# Patient Record
Sex: Male | Born: 1995 | Race: White | Hispanic: No | Marital: Single | Smoking: Never smoker
Health system: Southern US, Community
[De-identification: ages and names within clinical notes are randomized; demographics above are authoritative.]

---
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
## 2016-01-01 ENCOUNTER — Encounter: Payer: Self-pay | Admitting: Nurse Practitioner

## 2016-05-12 ENCOUNTER — Ambulatory Visit (INDEPENDENT_AMBULATORY_CARE_PROVIDER_SITE_OTHER): Payer: BLUE CROSS/BLUE SHIELD | Admitting: Family Medicine

## 2016-05-12 ENCOUNTER — Encounter: Payer: Self-pay | Admitting: Family Medicine

## 2016-05-12 VITALS — BP 110/70 | Temp 98.1°F | Wt 169.2 lb

## 2016-05-12 DIAGNOSIS — I889 Nonspecific lymphadenitis, unspecified: Secondary | ICD-10-CM

## 2016-05-12 DIAGNOSIS — J029 Acute pharyngitis, unspecified: Secondary | ICD-10-CM

## 2016-05-12 LAB — POCT RAPID STREP A (OFFICE): RAPID STREP A SCREEN: NEGATIVE

## 2016-05-12 MED ORDER — AZITHROMYCIN 250 MG PO TABS: ORAL_TABLET | ORAL | 0 refills | Status: DC

## 2016-05-12 MED ORDER — AMPHETAMINE-DEXTROAMPHETAMINE 5 MG PO TABS: ORAL_TABLET | ORAL | 0 refills | Status: DC

## 2016-05-12 MED ORDER — AMPHETAMINE-DEXTROAMPHET ER 15 MG PO CP24: 15.0000 mg | ORAL_CAPSULE | Freq: Every day | ORAL | 0 refills | Status: DC

## 2016-05-12 MED ORDER — OSELTAMIVIR PHOSPHATE 75 MG PO CAPS: 75.0000 mg | ORAL_CAPSULE | Freq: Two times a day (BID) | ORAL | 0 refills | Status: AC

## 2016-05-12 NOTE — Patient Instructions (Signed)
Exudative tonsillitie s

## 2016-05-12 NOTE — Progress Notes (Signed)
   Subjective:    Patient ID: Connor Cervantes, Connor Cervantes    DOB: 11/20/1995, 20 y.o.   MRN: [id number]  Fever   This is a new problem. The current episode started in the past 7 days. The problem occurs intermittently. The problem has been unchanged. The maximum temperature noted was 101 to 101.9 F. Associated symptoms include congestion, ear pain and a sore throat. Treatments tried: tylenol cold and flu. The treatment provided no relief.   Had a prodroem a week ago, fever chills felt bad  Woke up feelin g bretter  Results for orders placed or performed in visit on 05/12/16  POCT rapid strep A  Result Value Ref Range   Rapid Strep A Screen Negative Negative   Congestion   And felt better, now c o throat   Cough not sig and sig congestion  Neck sore and tend r   Both ears painful difficult to swallow     Mono like symtpoms  Review of Systems  Constitutional: Positive for fever.  HENT: Positive for congestion, ear pain and sore throat.        Objective:   Physical Exam  Alert active good hydration HEENT pharynx erythematous, some exudate, tender anterior nodes neck supple lungs clear. Heart regular in rhythm.  This Results for orders placed or performed in visit on 05/12/16  Strep A DNA probe  Result Value Ref Range   Strep Gp A Direct, DNA Probe Negative Negative  POCT rapid strep A  Result Value Ref Range   Rapid Strep A Screen Negative Negative   .      Assessment & Plan:  Impressio bacterial pharyngitis with secondary lymphadenitis versus early flu versus mono. 2 early to test for mono. Patient did have a chronic component and now acute symptomatology plan anti-flu therapy and antibacterial therapy rationale discussed symptom care discussed

## 2016-05-13 LAB — STREP A DNA PROBE: STREP GP A DIRECT, DNA PROBE: NEGATIVE

## 2016-05-14 ENCOUNTER — Telehealth: Payer: Self-pay | Admitting: Family Medicine

## 2016-05-14 MED ORDER — ONDANSETRON 4 MG PO TBDP: 4.0000 mg | ORAL_TABLET | Freq: Three times a day (TID) | ORAL | 0 refills | Status: DC | PRN

## 2016-05-14 NOTE — Telephone Encounter (Signed)
Nurses, call family and plz spk to mom a bit before issuing our recommendations to better document his symtooms. We can Can add zofran 4 odt , numb 16,  stay on same meds, if vom persists may need to to to er this weekend for I v fluids

## 2016-05-14 NOTE — Telephone Encounter (Signed)
Mother states she feels the vomiting is coming from the flu. Prescription sent electronically to pharmacy. Mother will take patient to ER if vomiting continues.

## 2016-05-14 NOTE — Telephone Encounter (Signed)
Patient seen on 05/12/16 by Dr. Brett CanalesSteve for sore throat.  Mom says that he is a lot worse than he was.  He is experiencing a lot of vomiting and can't keep anything down.  She wants to know what Dr. Brett CanalesSteve recommends?

## 2016-10-04 ENCOUNTER — Ambulatory Visit (INDEPENDENT_AMBULATORY_CARE_PROVIDER_SITE_OTHER): Payer: BLUE CROSS/BLUE SHIELD | Admitting: Family Medicine

## 2016-10-04 ENCOUNTER — Encounter: Payer: Self-pay | Admitting: Family Medicine

## 2016-10-04 VITALS — BP 122/76 | Temp 98.0°F | Ht 75.75 in | Wt 173.0 lb

## 2016-10-04 DIAGNOSIS — J329 Chronic sinusitis, unspecified: Secondary | ICD-10-CM

## 2016-10-04 DIAGNOSIS — J310 Chronic rhinitis: Secondary | ICD-10-CM

## 2016-10-04 DIAGNOSIS — F988 Other specified behavioral and emotional disorders with onset usually occurring in childhood and adolescence: Secondary | ICD-10-CM

## 2016-10-04 MED ORDER — AMPHETAMINE-DEXTROAMPHETAMINE 5 MG PO TABS: ORAL_TABLET | ORAL | 0 refills | Status: DC

## 2016-10-04 MED ORDER — AMPHETAMINE-DEXTROAMPHET ER 15 MG PO CP24: 15.0000 mg | ORAL_CAPSULE | Freq: Every day | ORAL | 0 refills | Status: DC

## 2016-10-04 MED ORDER — AMOXICILLIN-POT CLAVULANATE 875-125 MG PO TABS: 1.0000 | ORAL_TABLET | Freq: Two times a day (BID) | ORAL | 0 refills | Status: DC

## 2016-10-04 NOTE — Progress Notes (Signed)
   Subjective:    Patient ID: Jacquenette ShoneWilliam W Klausing, male    DOB: 03/23/1996, 21 y.o.   MRN: [id number]  Sinusitis  This is a new problem. Episode onset: one and half to two weeks. Associated symptoms include congestion and coughing. Treatments tried: dayquil.    major business dminsiatratioin  Going on one and a half weeks  Pos cong and stuffiness , Deep bronchial cough productive at times. Positive nasal discharge frontal headache. Worse with change of position  attention deficit and hyperactivity disorder Patient notes his medication overall is helping. No obvious side effects from medicine. Definitely does better when he takes it. Requests refill on medications.   Review of Systems  HENT: Positive for congestion.   Respiratory: Positive for cough.        Objective:   Physical Exam  Alert, mild malaise. Hydration good Vitals stable. frontal/ maxillary tenderness evident positive nasal congestion. pharynx normal neck supple  lungs clear/no crackles or wheezes. heart regular in rhythm       Assessment & Plan:  Impression rhinosinusitis likely post viral, discussed with patient. plan antibiotics prescribed. Questions answered. Symptomatic care discussed. warning signs discussed. WSL Plus element of bronchitis. Prescribed antibiotics. Symptom care discussed. Cough medicine discuss. ADHD medications refilled. Proper use discussed.

## 2016-11-04 ENCOUNTER — Encounter: Payer: Self-pay | Admitting: Family Medicine

## 2016-11-04 ENCOUNTER — Ambulatory Visit (INDEPENDENT_AMBULATORY_CARE_PROVIDER_SITE_OTHER): Payer: BLUE CROSS/BLUE SHIELD | Admitting: Family Medicine

## 2016-11-04 VITALS — BP 124/80 | Temp 98.4°F | Ht 75.75 in | Wt 179.0 lb

## 2016-11-04 DIAGNOSIS — J4520 Mild intermittent asthma, uncomplicated: Secondary | ICD-10-CM | POA: Diagnosis not present

## 2016-11-04 DIAGNOSIS — J209 Acute bronchitis, unspecified: Secondary | ICD-10-CM | POA: Diagnosis not present

## 2016-11-04 MED ORDER — ALBUTEROL SULFATE HFA 108 (90 BASE) MCG/ACT IN AERS: 2.0000 | INHALATION_SPRAY | Freq: Four times a day (QID) | RESPIRATORY_TRACT | 2 refills | Status: DC | PRN

## 2016-11-04 MED ORDER — CLARITHROMYCIN 500 MG PO TABS: 500.0000 mg | ORAL_TABLET | Freq: Two times a day (BID) | ORAL | 0 refills | Status: DC

## 2016-11-04 NOTE — Progress Notes (Signed)
   Subjective:    Patient ID: Connor Cervantes, male    DOB: 09/15/1995, 21 y.o.   MRN: [id number]  Cough  This is a new problem. Episode onset: early May. Treatments tried: amoxil.    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        Objective:   Physical Exam  Alert active no significant distress slight nasal congestion. HEENT otherwise normal. Lungs clear currently rare cough during exam heart regular in rhythm.      Assessment & Plan:  Impression subacute bronchitis likely with an element of reactive airways though non-auscultated today. Plan 1 more round of antibiotics appropriate for resistant bacteria. Albuterol 2 sprays 4 times a day symptom care discussed

## 2016-12-29 ENCOUNTER — Ambulatory Visit (INDEPENDENT_AMBULATORY_CARE_PROVIDER_SITE_OTHER): Payer: BLUE CROSS/BLUE SHIELD | Admitting: Family Medicine

## 2016-12-29 ENCOUNTER — Encounter (HOSPITAL_COMMUNITY): Payer: Self-pay | Admitting: Emergency Medicine

## 2016-12-29 ENCOUNTER — Emergency Department (HOSPITAL_COMMUNITY): Payer: BLUE CROSS/BLUE SHIELD

## 2016-12-29 ENCOUNTER — Encounter: Payer: Self-pay | Admitting: Family Medicine

## 2016-12-29 ENCOUNTER — Emergency Department (HOSPITAL_COMMUNITY)
Admission: EM | Admit: 2016-12-29 | Discharge: 2016-12-29 | Disposition: A | Discharge status: Home / Self Care | Payer: BLUE CROSS/BLUE SHIELD | Attending: Emergency Medicine | Admitting: Emergency Medicine

## 2016-12-29 VITALS — BP 112/60 | Temp 103.0°F | Ht 75.75 in | Wt 166.4 lb

## 2016-12-29 DIAGNOSIS — J111 Influenza due to unidentified influenza virus with other respiratory manifestations: Secondary | ICD-10-CM

## 2016-12-29 DIAGNOSIS — R531 Weakness: Secondary | ICD-10-CM | POA: Insufficient documentation

## 2016-12-29 DIAGNOSIS — Z79899 Other long term (current) drug therapy: Secondary | ICD-10-CM | POA: Diagnosis not present

## 2016-12-29 DIAGNOSIS — R000 Tachycardia, unspecified: Secondary | ICD-10-CM | POA: Diagnosis not present

## 2016-12-29 DIAGNOSIS — R509 Fever, unspecified: Secondary | ICD-10-CM

## 2016-12-29 DIAGNOSIS — R0689 Other abnormalities of breathing: Secondary | ICD-10-CM | POA: Insufficient documentation

## 2016-12-29 DIAGNOSIS — R69 Illness, unspecified: Secondary | ICD-10-CM | POA: Insufficient documentation

## 2016-12-29 LAB — URINALYSIS, ROUTINE W REFLEX MICROSCOPIC
Bacteria, UA: NONE SEEN
Bilirubin Urine: NEGATIVE
Glucose, UA: NEGATIVE mg/dL
Hgb urine dipstick: NEGATIVE
Ketones, ur: NEGATIVE mg/dL
Leukocytes, UA: NEGATIVE
Nitrite: NEGATIVE
Protein, ur: 30 mg/dL — AB
RBC / HPF: NONE SEEN RBC/hpf (ref 0–5)
Specific Gravity, Urine: 1.024 (ref 1.005–1.030)
Squamous Epithelial / HPF: NONE SEEN
pH: 7 (ref 5.0–8.0)

## 2016-12-29 LAB — CBC WITH DIFFERENTIAL/PLATELET
Basophils Absolute: 0 10*3/uL (ref 0.0–0.1)
Basophils Relative: 0 %
Eosinophils Absolute: 0 10*3/uL (ref 0.0–0.7)
Eosinophils Relative: 0 %
HCT: 42.8 % (ref 39.0–52.0)
Hemoglobin: 14.3 g/dL (ref 13.0–17.0)
Lymphocytes Relative: 13 %
Lymphs Abs: 1.3 10*3/uL (ref 0.7–4.0)
MCH: 30.7 pg (ref 26.0–34.0)
MCHC: 33.4 g/dL (ref 30.0–36.0)
MCV: 91.8 fL (ref 78.0–100.0)
Monocytes Absolute: 1.6 10*3/uL — ABNORMAL HIGH (ref 0.1–1.0)
Monocytes Relative: 16 %
Neutro Abs: 6.8 10*3/uL (ref 1.7–7.7)
Neutrophils Relative %: 71 %
Platelets: 164 10*3/uL (ref 150–400)
RBC: 4.66 MIL/uL (ref 4.22–5.81)
RDW: 13.4 % (ref 11.5–15.5)
WBC: 9.7 10*3/uL (ref 4.0–10.5)

## 2016-12-29 LAB — LIPASE, BLOOD: Lipase: 32 U/L (ref 11–51)

## 2016-12-29 LAB — COMPREHENSIVE METABOLIC PANEL
ALBUMIN: 4.2 g/dL (ref 3.5–5.0)
ALK PHOS: 88 U/L (ref 38–126)
ALT: 22 U/L (ref 17–63)
AST: 25 U/L (ref 15–41)
Anion gap: 9 (ref 5–15)
BILIRUBIN TOTAL: 0.6 mg/dL (ref 0.3–1.2)
BUN: 18 mg/dL (ref 6–20)
CO2: 26 mmol/L (ref 22–32)
CREATININE: 1.46 mg/dL — AB (ref 0.61–1.24)
Calcium: 9.2 mg/dL (ref 8.9–10.3)
Chloride: 102 mmol/L (ref 101–111)
GFR calc Af Amer: >60 mL/min (ref >60)
GFR calc non Af Amer: >60 mL/min (ref >60)
GLUCOSE: 102 mg/dL — AB (ref 65–99)
POTASSIUM: 3.6 mmol/L (ref 3.5–5.1)
Sodium: 137 mmol/L (ref 135–145)
TOTAL PROTEIN: 7.8 g/dL (ref 6.5–8.1)

## 2016-12-29 LAB — INFLUENZA PANEL BY PCR (TYPE A & B)
Influenza A By PCR: NEGATIVE
Influenza B By PCR: NEGATIVE

## 2016-12-29 LAB — MONONUCLEOSIS SCREEN: MONO SCREEN: NEGATIVE

## 2016-12-29 MED ORDER — IBUPROFEN 400 MG PO TABS
600.0000 mg | ORAL_TABLET | Freq: Once | ORAL | Status: AC
  Administered 2016-12-29: 600 mg via ORAL
  Filled 2016-12-29: qty 2

## 2016-12-29 MED ORDER — SODIUM CHLORIDE 0.9 % IV BOLUS (SEPSIS)
1000.0000 mL | Freq: Once | INTRAVENOUS | Status: AC
  Administered 2016-12-29: 1000 mL via INTRAVENOUS

## 2016-12-29 MED ORDER — SODIUM CHLORIDE 0.9 % IV BOLUS (SEPSIS)
1000.0000 mL | Freq: Once | INTRAVENOUS | Status: AC
Start: 2016-12-29 — End: 2016-12-29
  Administered 2016-12-29: 1000 mL via INTRAVENOUS

## 2016-12-29 MED ORDER — ACETAMINOPHEN 500 MG PO TABS
1000.0000 mg | ORAL_TABLET | Freq: Once | ORAL | Status: AC
  Administered 2016-12-29: 1000 mg via ORAL
  Filled 2016-12-29: qty 2

## 2016-12-29 NOTE — Progress Notes (Signed)
   Subjective:    Patient ID: Jacquenette ShoneWilliam W Flavell, male    DOB: 04/05/1996, 21 y.o.   MRN: [id number]  Fever     Patient is here with his mother Verlon AuLeslie, patient has had a bad cough since May which got better,but has returned. Reports had a temp this am of 104.4 took tylenol for this. Patient reports being very active the past week traveling. Diminished intake. Did get dehydrated by his own view. 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 Developed substantial fever. In fact Tmax of 104. Also significant cough No other concerns. Review of Systems  Constitutional: Positive for fever.       Objective:   Physical Exam  Alert moderate malaise systolic blood pressure dropped substantially laying to standing by 12 the 15 points pharynx erythematous neck supple lungs clear heart rare rhythm abdomen benign      Assessment & Plan:  Impression sudden onset of very impressive fever along with substantial malaise and orthostatic changes this is enough to warrant further urgent workup discussed with family since the ER

## 2016-12-29 NOTE — ED Provider Notes (Signed)
[facility]Y DEPT Provider Note   CSN: [id number] Arrival date & time: 12/29/16  1520     History   Chief Complaint No chief complaint on file.   HPI Connor Cervantes is a 21 y.o. male.   Fever   Pertinent negatives include no vomiting.    Patient presents with his mother who assists with the history of present illness. Patient is in generally well 21 year old male presenting with one day of fever, weakness,generalized fatigue. The patient states that he is generally well he does note history of recurrent viral illnesses. However, until yesterday the patient was generally well. He notes that in the days prior to becoming ill he traveled substantially, [place] from [place] to [place], went to a concert, [place] from [place] to [place], and during his time there did not eat sufficiently, sleep sufficiently. Yesterday, work, as a Engineer, drillingwarehouse staff, he felt progressively weak, and febrile. No associated vomiting, diarrhea, cough, sore throat, neck pain, rash. Minimal relief with Tylenol. Patient went to his physicians today, and after being evaluated, found to have fever, orthostatic hypotension, he was sent here for evaluation.   No past medical history on file.  Patient Active Problem List   Diagnosis Date Noted  . ADD (attention deficit disorder) 06/18/2013    No past surgical history on file.     Home Medications    Prior to Admission medications   Medication Sig Start Date End Date Taking? Authorizing Provider  acetaminophen (TYLENOL) 325 MG tablet Take 650 mg by mouth every 6 (six) hours as needed for moderate pain or fever.   Yes [provider]  albuterol (PROVENTIL HFA;VENTOLIN HFA) 108 (90 Base) MCG/ACT inhaler Inhale 2 puffs into the lungs every 6 (six) hours as needed for wheezing or shortness of breath. 11/04/16  Yes Merlyn AlbertLuking, Cristobal S, MD  amphetamine-dextroamphetamine (ADDERALL XR) 15 MG 24 hr capsule Take 1 capsule by mouth daily. 10/04/16  10/04/17  Merlyn AlbertLuking, Nikki S, MD  amphetamine-dextroamphetamine (ADDERALL) 5 MG tablet Take 1 tablet po in the afternoon around 3 pm. 10/04/16   Merlyn AlbertLuking, Rolf S, MD    Family History No family history on file.  Social History Social History  Substance Use Topics  . Smoking status: Never Smoker  . Smokeless tobacco: Never Used  . Alcohol use No     Allergies   Patient has no known allergies.   Review of Systems Review of Systems  Constitutional: Positive for fever.       Per HPI, otherwise negative  HENT:       Per HPI, otherwise negative  Respiratory:       Per HPI, otherwise negative  Cardiovascular:       Per HPI, otherwise negative  Gastrointestinal: Negative for vomiting.  Endocrine:       Negative aside from HPI  Genitourinary:       Neg aside from HPI   Musculoskeletal:       Per HPI, otherwise negative  Skin: Negative.   Neurological: Positive for weakness. Negative for syncope.     Physical Exam Updated Vital Signs There were no vitals taken for this visit.  Physical Exam  Constitutional: He is oriented to person, place, and time. He appears well-developed. No distress.  HENT:  Head: Normocephalic and atraumatic.  Eyes: Conjunctivae and EOM are normal.  Neck: No neck rigidity.  No cervical adenopathy, neck is nontender, with no deformity Normal range of motion  Cardiovascular: Regular rhythm.  Tachycardia present.   Pulmonary/Chest: No  stridor. He has decreased breath sounds.  Abdominal: He exhibits no distension. There is no tenderness. There is no rigidity and no guarding.  Musculoskeletal: He exhibits no edema.  Neurological: He is alert and oriented to person, place, and time.  Skin: Skin is warm and dry.  Psychiatric: He has a normal mood and affect.  Nursing note and vitals reviewed.    ED Treatments / Results  Labs (all labs ordered are listed, but only abnormal results are displayed) Labs Reviewed  COMPREHENSIVE METABOLIC PANEL -  Abnormal; Notable for the following:       Result Value   Glucose, Bld 102 (*)    Creatinine, Ser 1.46 (*)    All other components within normal limits  CBC WITH DIFFERENTIAL/PLATELET - Abnormal; Notable for the following:    Monocytes Absolute 1.6 (*)    All other components within normal limits  URINALYSIS, ROUTINE W REFLEX MICROSCOPIC - Abnormal; Notable for the following:    Protein, ur 30 (*)    All other components within normal limits  LIPASE, BLOOD  MONONUCLEOSIS SCREEN  INFLUENZA PANEL BY PCR (TYPE A & B)    Radiology Dg Chest 2 View  Result Date: 12/29/2016 CLINICAL DATA:  Fever EXAM: CHEST  2 VIEW COMPARISON:  Chest radiograph 03/15/2005 FINDINGS: The heart size and mediastinal contours are within normal limits. Both lungs are clear. The visualized skeletal structures are unremarkable. IMPRESSION: No active cardiopulmonary disease. Electronically Signed   By: Deatra Robinson M.D.   On: 12/29/2016 18:42    Procedures Procedures (including critical care time)  Medications Ordered in ED Medications  sodium chloride 0.9 % bolus 1,000 mL (not administered)     Initial Impression / Assessment and Plan / ED Course  I have reviewed the triage vital signs and the nursing notes.  Pertinent labs & imaging results that were available during my care of the patient were reviewed by me and considered in my medical decision making (see chart for details).  9:39 PM Patient awake and alert, has walked to the bathroom, tachycardia has resolved he states that he feels better, appears better, mother corroborates same. With reassuring results, and resolution of his tachycardia, and improved overall clinical condition, the patient will follow up with his primary care physician. There suspicion for viral process and/or dehydration is contributing to his fever and orthostasis. No evidence for bacteremia, sepsis, UTI, pneumonia, mono.  Final Clinical Impressions(s) / ED Diagnoses   Final  diagnoses:  None    New Prescriptions New Prescriptions   No medications on file     Gerhard Munch, MD 12/29/16 2140

## 2016-12-29 NOTE — ED Triage Notes (Signed)
Pt reports fever, chills, lightheadedness, dizziness since midnight last night. Highest tempt at home was 104.4 at 9am.  0300 103.6-2 tylenol  0900 104.4-2 tylenol  0215-2 tylenol given by Dr. Gerda DissLuking.

## 2016-12-29 NOTE — Discharge Instructions (Signed)
As discussed, your evaluation today has been largely reassuring.  But, it is important that you monitor your condition carefully, and do not hesitate to return to the ED if you develop new, or concerning changes in your condition. ? ?Otherwise, please follow-up with your physician for appropriate ongoing care. ? ?

## 2016-12-29 NOTE — ED Notes (Signed)
Pt alert & oriented x4, stable gait. Patient given discharge instructions, paperwork & prescription(s). Patient  instructed to stop at the registration desk to finish any additional paperwork. Patient verbalized understanding. Pt left department w/ no further questions. 

## 2016-12-30 ENCOUNTER — Encounter: Payer: Self-pay | Admitting: Family Medicine

## 2017-01-02 ENCOUNTER — Encounter (HOSPITAL_COMMUNITY): Payer: Self-pay | Admitting: *Deleted

## 2017-01-02 ENCOUNTER — Ambulatory Visit (HOSPITAL_COMMUNITY)
Admission: EM | Admit: 2017-01-02 | Discharge: 2017-01-02 | Disposition: A | Discharge status: Home / Self Care | Payer: BLUE CROSS/BLUE SHIELD | Attending: Family Medicine | Admitting: Family Medicine

## 2017-01-02 DIAGNOSIS — B353 Tinea pedis: Secondary | ICD-10-CM | POA: Insufficient documentation

## 2017-01-02 DIAGNOSIS — R509 Fever, unspecified: Secondary | ICD-10-CM

## 2017-01-02 LAB — POCT INFECTIOUS MONO SCREEN: Mono Screen: NEGATIVE

## 2017-01-02 MED ORDER — DOXYCYCLINE HYCLATE 100 MG PO CAPS: 100.0000 mg | ORAL_CAPSULE | Freq: Two times a day (BID) | ORAL | 0 refills | Status: DC

## 2017-01-02 MED ORDER — TERBINAFINE HCL 1 % EX CREA: 1.0000 "application " | TOPICAL_CREAM | Freq: Two times a day (BID) | CUTANEOUS | 0 refills | Status: DC

## 2017-01-02 NOTE — ED Provider Notes (Signed)
  [facility]   [id number] 01/02/17 Arrival Time: 1441  ASSESSMENT & PLAN:  1. Tinea pedis of left foot   2. Febrile illness     Meds ordered this encounter  Medications  . terbinafine (LAMISIL) 1 % cream    Sig: Apply 1 application topically 2 (two) times daily.    Dispense:  30 g    Refill:  0    Order Specific Question:   Supervising Provider    Answer:   Elvina SidleLAUENSTEIN, KURT [[id number]]  . doxycycline (VIBRAMYCIN) 100 MG capsule    Sig: Take 1 capsule (100 mg total) by mouth 2 (two) times daily.    Dispense:  14 capsule    Refill:  0    Order Specific Question:   Supervising Provider    Answer:   Elvina SidleLAUENSTEIN, KURT [[id number]]    Likely hood of RMSF is low but will draw labs and start on doxycyline.  Reviewed expectations re: course of current medical issues. Questions answered. Outlined signs and symptoms indicating need for more acute intervention. Patient verbalized understanding. After Visit Summary given.   SUBJECTIVE:  Connor Cervantes is a 21 y.o. male who presents with complaint of fatigue, and rash on his hands and feet. He developed a high fever on 12/28/2016, and was evaluated in the ER on 08/082018. He had an extensive work up which was negative. He states he is generally feeling better, but developed an itchy rash yesterday. Neighbor is an ER doctor and informed him to go to the clinic to be evaluated for RMSF. He is not aware of tick exposure and has not been in the woods, he has been at music in open fields. No nausea, no vomiting, no other significant history.  ROS: As per HPI, remainder of ROS negative.   OBJECTIVE:  Vitals:   01/02/17 1554  BP: (!) 96/54  Pulse: 80  Resp: 16  Temp: 98.9 F (37.2 C)  TempSrc: Oral  SpO2: 100%     General appearance: alert; no distress HEENT: normocephalic; atraumatic; conjunctivae normal; , No oropharyngeal edema or erythema, no submandibular and tonsillar lymphadenopathy Neck: No cervical lymphadenopathy Lungs:  clear to auscultation bilaterally Heart: regular rate and rhythm Abdomen: soft, non-tender; bowel sounds normal; no masses or organomegaly; no guarding or rebound tenderness Back: no CVA tenderness Extremities: no cyanosis or edema; symmetrical with no gross deformities Skin: warm and dry, see attached photographs for description of rash Neurologic: Grossly normal Psychological:  alert and cooperative; normal mood and affect          Procedures:     Results for orders placed or performed during the hospital encounter of 01/02/17  Infectious mono screen, POC  Result Value Ref Range   Mono Screen NEGATIVE NEGATIVE    Labs Reviewed  B. BURGDORFI ANTIBODIES  ROCKY MTN SPOTTED FVR ABS PNL(IGG+IGM)  POCT INFECTIOUS MONO SCREEN    No results found.  No Known Allergies  PMHx, SurgHx, SocialHx, Medications, and Allergies were reviewed in the Visit Navigator and updated as appropriate.       Dorena BodoKennard, Dashea Mcmullan, NP 01/02/17 [phone]

## 2017-01-02 NOTE — Discharge Instructions (Signed)
Mono test was negative, but this is not an accurate test. Lyme and RMSF are pending, due to risks of this illness I will start him on doxycycline. Take one tablet by mouth twice a day. Do not participate in contact sports for at least 6 weeks in case this is mono. Follow up with your regular doctor as needed.

## 2017-01-02 NOTE — ED Triage Notes (Signed)
Patient reports not feeling well since Tuesday night. Seen at ED for eval of fever, fatigue, and dehydration. Tested for mono, flu, and chest xray. Patient now with rash to hands and feet. None in mouth. Rash appeared Friday night. No fever today.

## 2017-01-03 LAB — B. BURGDORFI ANTIBODIES: B burgdorferi Ab IgG+IgM: <0.91 {ISR} (ref 0.00–0.90)

## 2017-01-04 LAB — ROCKY MTN SPOTTED FVR ABS PNL(IGG+IGM)
RMSF IGG: NEGATIVE
RMSF IgM: 0.63 index (ref 0.00–0.89)

## 2017-01-05 ENCOUNTER — Telehealth: Payer: Self-pay | Admitting: Family Medicine

## 2017-01-05 NOTE — Telephone Encounter (Signed)
Patient mother is aware. She will schedule an appt w Dr.Scott on 08/16/218.

## 2017-01-05 NOTE — Telephone Encounter (Signed)
Mom called back, return call when able.

## 2017-01-05 NOTE — Telephone Encounter (Signed)
Noted  

## 2017-01-05 NOTE — Telephone Encounter (Signed)
I called and left a vm for the pt's mother asked that she r/c. Needs an appt w Dr.Scott 01/06/2017.CS

## 2017-01-05 NOTE — Telephone Encounter (Signed)
rmsf neg, definitely too complicated to manage over the phone, rec f u visit with dr Lorin Picketscott tomorrow

## 2017-01-05 NOTE — Telephone Encounter (Signed)
[facility] to [facility] [place] and had tests run for mono and rocky [place] spotted fever because he developed a rash over the weekend.  Patients mother wants to know if we can see these results and also if we can give her those results.

## 2017-01-05 NOTE — Telephone Encounter (Signed)
Patient mother Connor Cervantes called and says her son developed a rash this past Sunday night,which was not there when he was seen by you last week.She asked her neighbor who is an [employer] developerr Dr what he thought it could be,and he thought it could be [place] spotted fever.He told her her son could still have mono as it takes at least ten days of having it to show up positive. She took him to [facility] urgent care on the 12 th and they recollected a mono test which was negative, and [place] spotted screen which was not back when they left from the urgent care.She wants to know if you can research the results for her,and let her know if positive. Also wants to know if pt should have another mono screen done.She states her son starts school in the next few days and they still do not know what is wrong with him.He is on Doxycycline and the rash is now going away, he no longer has a fever.

## 2017-01-06 ENCOUNTER — Encounter: Payer: Self-pay | Admitting: Family Medicine

## 2017-01-06 ENCOUNTER — Ambulatory Visit (INDEPENDENT_AMBULATORY_CARE_PROVIDER_SITE_OTHER): Payer: BLUE CROSS/BLUE SHIELD | Admitting: Family Medicine

## 2017-01-06 VITALS — BP 110/74 | Temp 98.2°F | Ht 75.0 in | Wt 167.0 lb

## 2017-01-06 DIAGNOSIS — B349 Viral infection, unspecified: Secondary | ICD-10-CM | POA: Diagnosis not present

## 2017-01-06 DIAGNOSIS — B353 Tinea pedis: Secondary | ICD-10-CM | POA: Diagnosis not present

## 2017-01-06 NOTE — Patient Instructions (Signed)
Appears to have a viral syndrome  Lab test negative for RMSF  Pictures of rash point toward possible coxackie virus ( same virus that causes Hand foot mouth

## 2017-01-06 NOTE — Progress Notes (Signed)
   Subjective:    Patient ID: Connor Cervantes, male    DOB: 03/12/1996, 21 y.o.   MRN: [id number]  HPIRash on hands and toes. Has mostly cleared up. Wanting results of RMSF test. Taking doxy.  The patient states started about a week and half ago with slight soreness in the throat fatigue tiredness fever not feeling good. It was concerning for the possibility of a virus versus mono versus other illness. Went to the ER had testing done nonconclusive more likely viral then over the weekend had a unusual rash that occurred on his hands and feet but he states that he was not having any super high fever at that time. He was having some fatigue He was checked and thought to have possibility of RMSF  Review of Systems Please see above. Currently right now denies headaches abdominal pain fever chills sweats states energy level improving does relate some head cold symptoms of head congestion drainage rash has gotten better    Objective:   Physical Exam Lungs are clear no crackles respiratory rate normal heart regular no murmurs pulses normal abdomen is soft extremities no edema rash is healed up patient does have tinea pedis       Assessment & Plan:  Tinea pedis-use antifungal creams keep dry socks on  Recent viral syndrome with rash-I looked at the pictures more than likely this is coxsackievirus it is possible this could be hand-foot-and-mouth it does appear to be healing no further testing necessary  I do think it's reasonable for the patient to finish out his doxycycline to cover for the possibility of any tick related illness but I doubt that this rash was RMSF  The results of the lab work was discussed with the patient as well as he was told if he starts running high fever body aches fever chills sweats anything unusual return of rash to immediately call us recheck

## 2017-05-19 ENCOUNTER — Encounter: Payer: Self-pay | Admitting: Family Medicine

## 2017-05-19 ENCOUNTER — Ambulatory Visit (INDEPENDENT_AMBULATORY_CARE_PROVIDER_SITE_OTHER): Payer: BLUE CROSS/BLUE SHIELD | Admitting: Family Medicine

## 2017-05-19 VITALS — Ht 75.0 in

## 2017-05-19 DIAGNOSIS — F988 Other specified behavioral and emotional disorders with onset usually occurring in childhood and adolescence: Secondary | ICD-10-CM

## 2017-05-19 MED ORDER — AMPHETAMINE-DEXTROAMPHET ER 15 MG PO CP24: 15.0000 mg | ORAL_CAPSULE | Freq: Every day | ORAL | 0 refills | Status: DC

## 2017-05-19 MED ORDER — AMPHETAMINE-DEXTROAMPHETAMINE 5 MG PO TABS: ORAL_TABLET | ORAL | 0 refills | Status: DC

## 2017-05-19 NOTE — Progress Notes (Signed)
   Subjective:    Patient ID: Connor Cervantes, male    DOB: 10/27/1995, 21 y.o.   MRN: [id number]  HPI  Patient arrives for a follow up on ADHD. Patient states he is doing well on current dose of medication. Patient was seen today for ADD checkup. -weight, vital signs reviewed. Good compliance takes on his The following items were covered. -Compliance with medication : Difficult school days does not take them on every day  -Problems with completing homework, paying attention/taking good notes in school: States it does help him with focusing  -grades: Grades are doing well  - Eating patterns : Eating is doing well  -sleeping: Gets adequate sleep  -Additional issues or questions: Denies any other particular troubles  Review of Systems  Constitutional: Negative for activity change, appetite change and fatigue.  Gastrointestinal: Negative for abdominal pain.  Neurological: Negative for headaches.  Psychiatric/Behavioral: Negative for behavioral problems.       Objective:   Physical Exam  Constitutional: He appears well-developed and well-nourished. No distress.  HENT:  Head: Normocephalic.  Cardiovascular: Normal rate, regular rhythm and normal heart sounds.  No murmur heard. Pulmonary/Chest: Effort normal and breath sounds normal.  Neurological: He is alert.  Skin: Skin is warm and dry.  Psychiatric: He has a normal mood and affect. His behavior is normal.   Mild acne noted       Assessment & Plan:  Acne-patient does home care he will follow-up with us if any problems  The patient was seen today as part of the visit regarding ADD. Medications were reviewed with the patient as well as compliance. Side effects were checked for. Discussion regarding effectiveness was held. Prescriptions were written. Patient reminded to follow-up in approximately 3 months. Behavioral and study issues were addressed.  Follow-up by summer

## 2018-03-17 ENCOUNTER — Other Ambulatory Visit: Payer: Self-pay | Admitting: Family Medicine

## 2018-03-17 ENCOUNTER — Telehealth: Payer: Self-pay | Admitting: Family Medicine

## 2018-03-17 NOTE — Telephone Encounter (Signed)
Left a message to r/c. 

## 2018-03-17 NOTE — Telephone Encounter (Signed)
Mom is calling in to see if Dr. Lorin Picket would give him a one month refill for amphetamine-dextroamphetamine (ADDERALL XR) 15 MG 24 hr capsule and amphetamine-dextroamphetamine (ADDERALL) 5 MG tablet. 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 He is completely out of the medication and has test next week. [place] and is [place] calls. Moms cell is [phone].

## 2018-03-17 NOTE — Telephone Encounter (Signed)
Please talk with mother Find out from her [facility] [address] [facility] fine or does she want it sent to a different pharmacy I can send this in later this afternoon by 6 PM Please find out to him reply to me thank you

## 2018-03-20 ENCOUNTER — Telehealth: Payer: Self-pay | Admitting: Family Medicine

## 2018-03-20 ENCOUNTER — Other Ambulatory Visit: Payer: Self-pay | Admitting: Family Medicine

## 2018-03-20 MED ORDER — AMPHETAMINE-DEXTROAMPHET ER 15 MG PO CP24: 15.0000 mg | ORAL_CAPSULE | Freq: Every day | ORAL | 0 refills | Status: DC

## 2018-03-20 MED ORDER — AMPHETAMINE-DEXTROAMPHETAMINE 5 MG PO TABS: ORAL_TABLET | ORAL | 0 refills | Status: DC

## 2018-03-20 NOTE — Telephone Encounter (Signed)
[facility] This was completed at 10 PM

## 2018-03-20 NOTE — Telephone Encounter (Signed)
[facility] [place] on [address]. He states he does not need a call back. He will check with pharm later today.

## 2018-03-20 NOTE — Telephone Encounter (Signed)
As a courtesy I refilled this patient's medicine Please see other telephone message Please send this patient notification that if he needs more refills he will need to be seen as an office visit

## 2018-03-20 NOTE — Telephone Encounter (Signed)
Left message to return call 

## 2018-04-29 IMAGING — DX DG CHEST 2V
2 series · 2 of 2 positions shown · non-contrast
Comparison: Chest radiograph 03/15/2005

CLINICAL DATA: Fever

EXAM:
CHEST  2 VIEW

[chest pa]
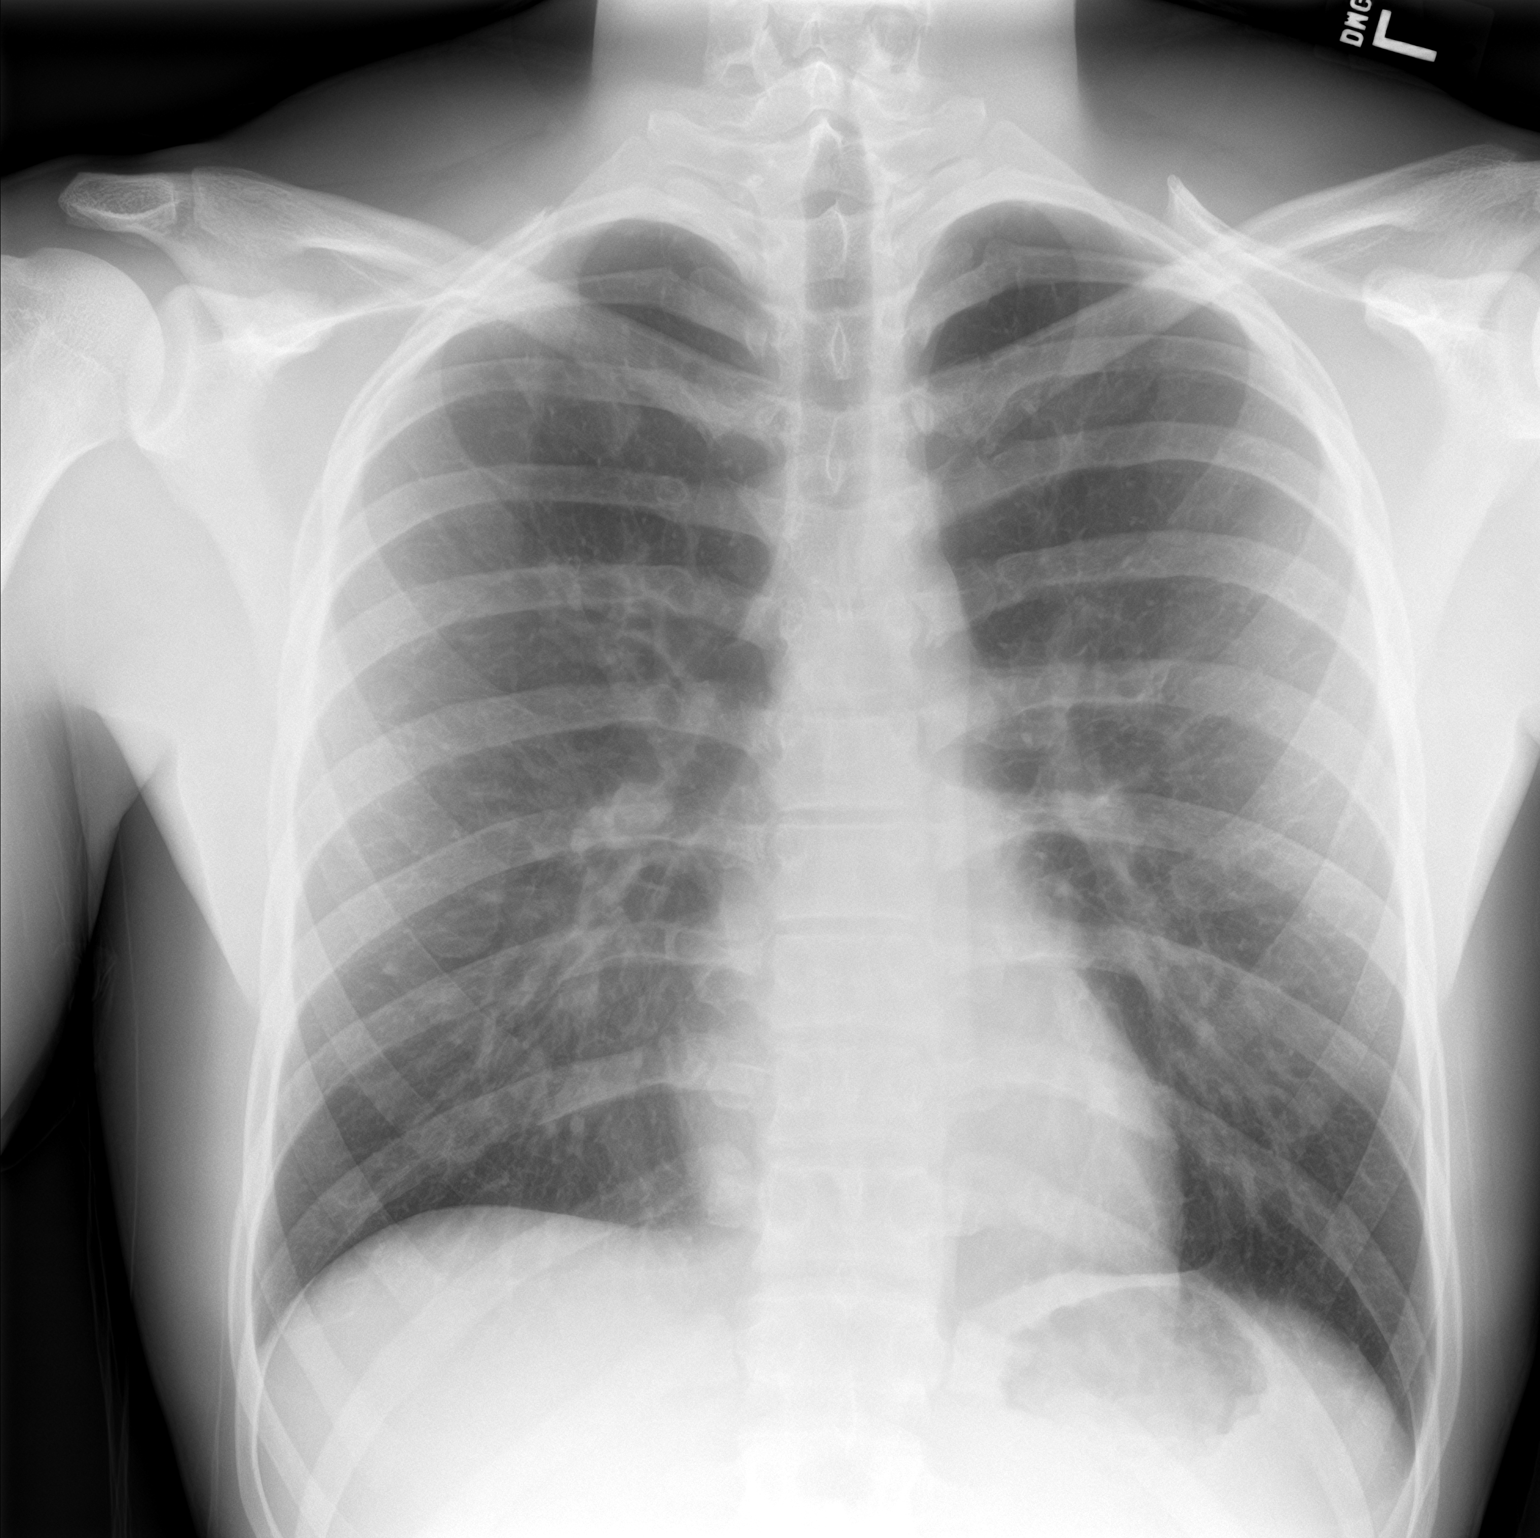

[chest lat]
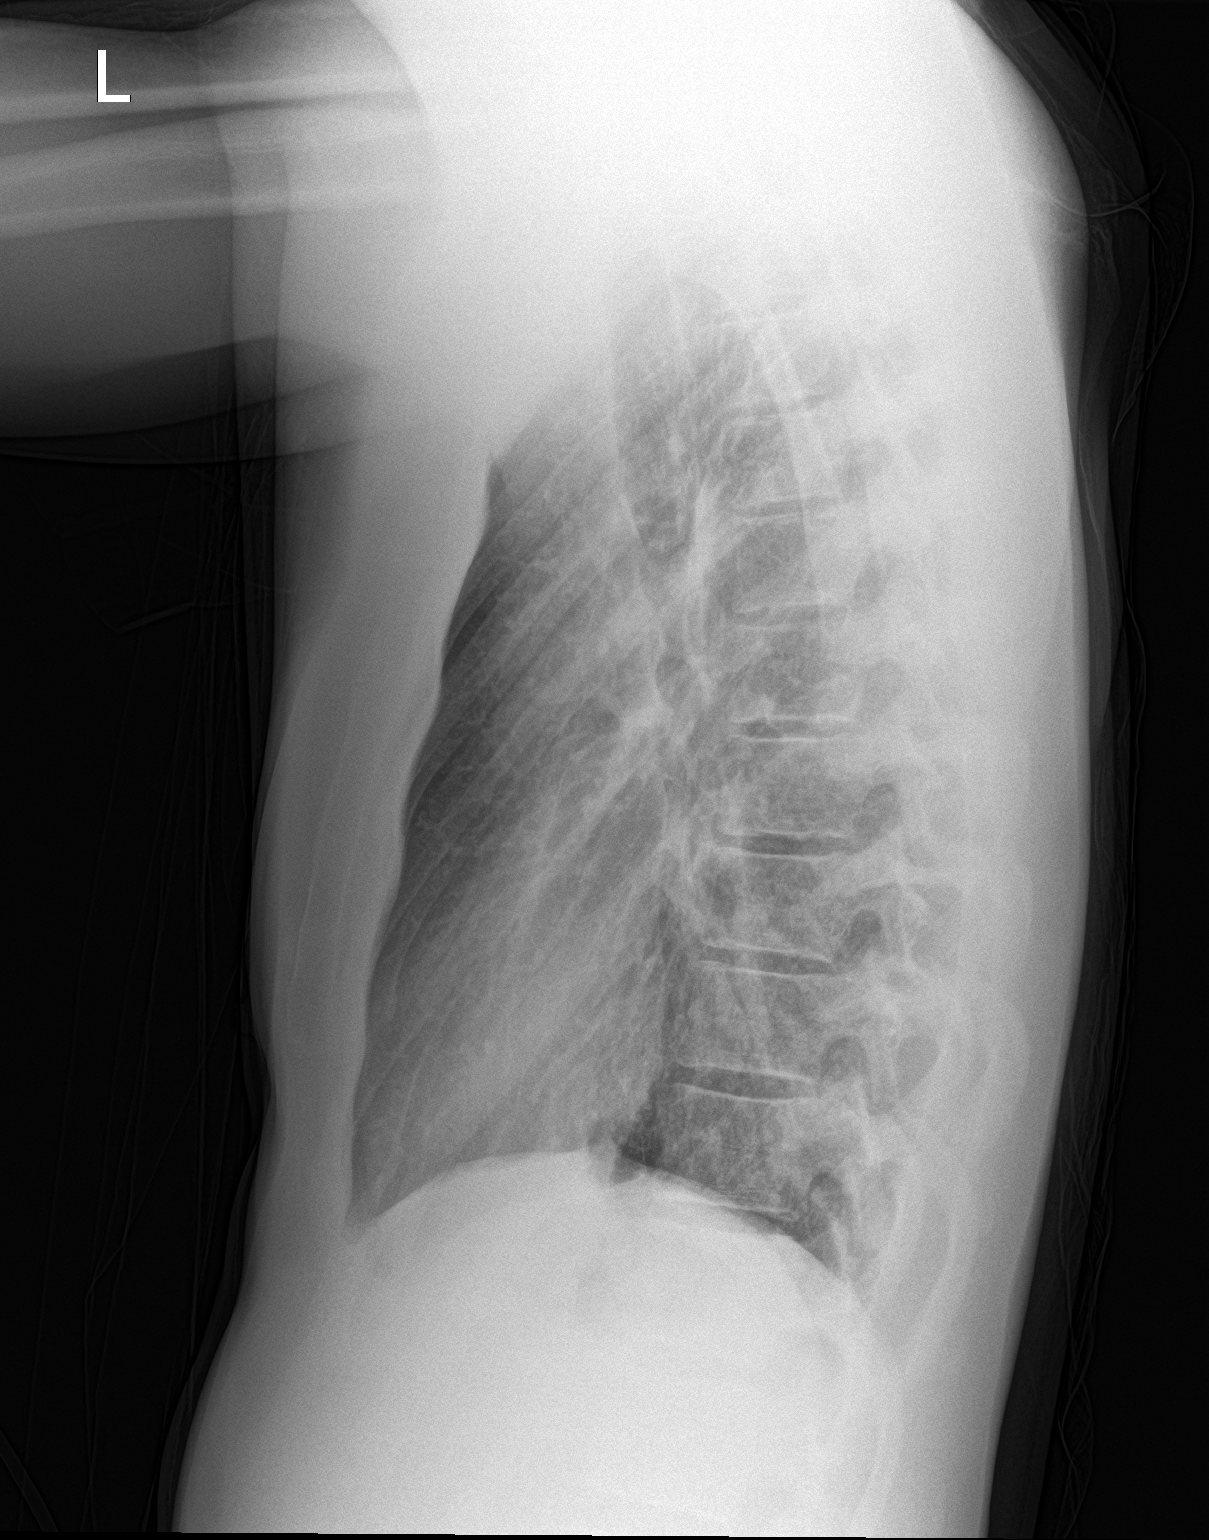

[2 of 2 positions shown; findings below may reference images not displayed]

FINDINGS: The heart size and mediastinal contours are within normal limits.
Both lungs are clear. The visualized skeletal structures are
unremarkable.
IMPRESSION: No active cardiopulmonary disease.

## 2018-06-06 ENCOUNTER — Encounter: Payer: Self-pay | Admitting: Family Medicine

## 2018-06-06 ENCOUNTER — Ambulatory Visit: Payer: 59 | Admitting: Family Medicine

## 2018-06-06 VITALS — BP 112/70 | Ht 75.0 in | Wt 174.0 lb

## 2018-06-06 DIAGNOSIS — F988 Other specified behavioral and emotional disorders with onset usually occurring in childhood and adolescence: Secondary | ICD-10-CM

## 2018-06-06 MED ORDER — AMPHETAMINE-DEXTROAMPHET ER 15 MG PO CP24: ORAL_CAPSULE | ORAL | 0 refills | Status: DC

## 2018-06-06 MED ORDER — AMPHETAMINE-DEXTROAMPHETAMINE 5 MG PO TABS: ORAL_TABLET | ORAL | 0 refills | Status: DC

## 2018-06-06 MED ORDER — AMPHETAMINE-DEXTROAMPHET ER 15 MG PO CP24: 15.0000 mg | ORAL_CAPSULE | Freq: Every day | ORAL | 0 refills | Status: DC

## 2018-06-06 NOTE — Progress Notes (Signed)
   Subjective:    Patient ID: Connor Cervantes, male    DOB: 1995-06-12, 23 y.o.   MRN: [id number]  HPI  Patient was seen today for ADD checkup.  This patient does have ADD.  Patient takes medications for this.  If this does help control overall symptoms.  Please see below. -weight, vital signs reviewed.  The following items were covered. -Compliance with medication :Yes  -Problems with completing homework, paying attention/taking good notes in school: Graduated,but no problems with paying attention when he takes it.  -grades: N/A  - Eating patterns : Good, no weight loss  -sleeping: Good, as long as he takes med early enough no effect.   -Additional issues or questions: None  Graduated in December, working in [employer], business degree  Taking medication 3-4 days per week, only on days he works. Takes the XR 15 mg dose around noon. Sometimes takes 5 mg dose around 4:15. Usually not on same day, just depends on how long shift is which medication he takes.   Meds help him stay focused and remember things. About to start applying for jobs/interviews so may need to take it more often. Denies adverse effects.   Working on [employer], exercising consistently with lifting weights.    Review of Systems  Constitutional: Negative for activity change, appetite change, fever and unexpected weight change.  Respiratory: Negative for shortness of breath.   Cardiovascular: Negative for chest pain and palpitations.  Gastrointestinal: Negative for abdominal pain.  Psychiatric/Behavioral: Positive for decreased concentration. Negative for sleep disturbance.       Objective:   Physical Exam Vitals signs and nursing note reviewed.  Constitutional:      General: He is not in acute distress.    Appearance: He is well-developed.  HENT:     Head: Normocephalic and atraumatic.  Neck:     Musculoskeletal: Neck supple.  Cardiovascular:     Rate and Rhythm: Normal rate and regular rhythm.   Heart sounds: Normal heart sounds. No murmur.  Pulmonary:     Effort: Pulmonary effort is normal. No respiratory distress.     Breath sounds: Normal breath sounds.  Skin:    General: Skin is warm and dry.  Neurological:     Mental Status: He is alert and oriented to person, place, and time.  Psychiatric:        Mood and Affect: Mood normal.           Assessment & Plan:  Attention deficit disorder, unspecified hyperactivity presence  The patient was seen today as part of the visit regarding ADD. Medications were reviewed with the patient as well as compliance. Side effects were checked for. Discussion regarding effectiveness was held. Prescriptions were written. Patient reminded to follow-up in approximately 3 months. Pt reports significant benefit in his ability to focus at work when taking medication.   Complied with [facility] law, drug registry was checked and verified while present with the patient.   Recommend wellness exam with next ADD f/u with Dr. Lorin Picket. Pt will send Dr. Felicity Coyer message in about a month to see how he is doing on his medications.

## 2018-06-15 ENCOUNTER — Telehealth: Payer: Self-pay | Admitting: Family Medicine

## 2018-06-15 NOTE — Telephone Encounter (Signed)
PA is needed for Dextroamp-amphetamine 5 mg tablet. Take one tablet daily around 3. Contacted number on PA [phone] and they are requesting additional documentation medical records stating that patient is stable on medication. Fax number [phone]. Please advise. Thank you

## 2018-06-18 ENCOUNTER — Encounter: Payer: Self-pay | Admitting: Family Medicine

## 2018-06-18 NOTE — Telephone Encounter (Signed)
I filled out the form I also dictated a letter Have Connor Cervantes print the letter send it with the most recent office visit note and with this authorization form also please finish the form thank you

## 2018-06-19 NOTE — Telephone Encounter (Signed)
Erica please address. Form given to [place].

## 2018-06-19 NOTE — Telephone Encounter (Signed)
Faxed over letter and form to [facility] this morning.

## 2018-06-20 ENCOUNTER — Telehealth: Payer: Self-pay | Admitting: Family Medicine

## 2018-06-20 NOTE — Telephone Encounter (Signed)
Fax from [facility] stating that Adderall XR Cap 15 mg is on plans list of covered drugs. Does not need prior authorization at this time.

## 2018-06-30 ENCOUNTER — Telehealth: Payer: Self-pay | Admitting: *Deleted

## 2018-06-30 NOTE — Telephone Encounter (Signed)
Adderral 5 mg #30 approved by insurance 06/19/18-06/20/19. Pharmacy notified.

## 2018-11-13 ENCOUNTER — Telehealth: Payer: Self-pay | Admitting: Family Medicine

## 2018-11-13 ENCOUNTER — Other Ambulatory Visit: Payer: BLUE CROSS/BLUE SHIELD

## 2018-11-13 DIAGNOSIS — Z20822 Contact with and (suspected) exposure to covid-19: Secondary | ICD-10-CM

## 2018-11-13 NOTE — Telephone Encounter (Signed)
May have referral for COVID testing-it is fine to do referral for the patient and also if Magda Paganini was around them as well she can do the testing Please explain to them out this process works And that the testing results can take 3 to 5 days to come back During that time anyone who is been tested for COVID should essentially self isolate until test results are back Certainly if they are having any signs or symptoms of illness I would recommend video visit

## 2018-11-13 NOTE — Telephone Encounter (Signed)
Per DPR spoke with patient's mother Cherylynn Ridges.  Scheduled patient for COVID 19 testing today at 2:15 pm at [facility]. Advised patient should stay in car and wear a mask.

## 2018-11-13 NOTE — Telephone Encounter (Signed)
Left message to return call 

## 2018-11-13 NOTE — Addendum Note (Signed)
Addended by: Denyce Robert on: 11/13/2018 12:15 PM   Modules accepted: Orders

## 2018-11-13 NOTE — Telephone Encounter (Signed)
Discussed with pt's mother who is on dpr. Please order covid 19 testing in [place]. His mother leslie deaton would like to do testing at the same time. Message was sent on her also.

## 2018-11-13 NOTE — Telephone Encounter (Signed)
Mom Connor Cervantes (DPR on file, ok to talk to) calling because son had a friend fly in from [place] 2 weeks ago and they spent the week together and now friend is back home and has tested positive for COVID. Mom is worried because they have all been exposed and she has elderly parents.  No one has any symptoms but said son's friend didn't really have symptoms either. Covid was discovered through a routine office visit. She is worried and wants to know what to do.  Wants to have son tested as soon as possible and possibly herself.

## 2018-11-18 LAB — NOVEL CORONAVIRUS, NAA: SARS-CoV-2, NAA: NOT DETECTED

## 2018-12-12 ENCOUNTER — Other Ambulatory Visit: Payer: Self-pay

## 2018-12-12 ENCOUNTER — Telehealth: Payer: Self-pay | Admitting: Family Medicine

## 2018-12-12 DIAGNOSIS — Z20822 Contact with and (suspected) exposure to covid-19: Secondary | ICD-10-CM

## 2018-12-12 NOTE — Telephone Encounter (Signed)
Mom Magda Paganini calling because son had negative test in June but now has been working around a coworker that has been really sick and is waiting for covid test results.  Mom would like to [facility] and she would like to be tested herself. She said that Dr. Nicki Reaper ordered her testing last time as well.  Neither has symptoms but she said the coworker has gotten really ill and has all the symptoms.  I told her she may not need an order now but she doesn't want to get there and not have one.

## 2018-12-12 NOTE — Telephone Encounter (Signed)
Contacted pt mom. Informed mom that at this time she will not need an order from a provider. Mom states that patient was exposed to a potential positive case. The person that Connor Cervantes is working with has all the symptoms of COVID, has been tested and is awaiting results. Pt however is not having any symptoms but would like to get tested again to be on the safe side.

## 2018-12-15 LAB — NOVEL CORONAVIRUS, NAA: SARS-CoV-2, NAA: NOT DETECTED

## 2019-01-09 ENCOUNTER — Ambulatory Visit (HOSPITAL_COMMUNITY)
Admission: EM | Admit: 2019-01-09 | Discharge: 2019-01-09 | Disposition: A | Discharge status: Home / Self Care | Payer: 59 | Attending: Family Medicine | Admitting: Family Medicine

## 2019-01-09 ENCOUNTER — Other Ambulatory Visit: Payer: Self-pay

## 2019-01-09 ENCOUNTER — Encounter (HOSPITAL_COMMUNITY): Payer: Self-pay

## 2019-01-09 DIAGNOSIS — L239 Allergic contact dermatitis, unspecified cause: Secondary | ICD-10-CM | POA: Diagnosis not present

## 2019-01-09 MED ORDER — PREDNISONE 10 MG PO TABS: ORAL_TABLET | ORAL | 0 refills | Status: DC

## 2019-01-09 MED ORDER — TRIAMCINOLONE ACETONIDE 0.1 % EX CREA: 1.0000 "application " | TOPICAL_CREAM | Freq: Two times a day (BID) | CUTANEOUS | 0 refills | Status: DC

## 2019-01-09 MED ORDER — METHYLPREDNISOLONE SODIUM SUCC 125 MG IJ SOLR
INTRAMUSCULAR | Status: AC
  Filled 2019-01-09: qty 2

## 2019-01-09 MED ORDER — METHYLPREDNISOLONE SODIUM SUCC 125 MG IJ SOLR
125.0000 mg | Freq: Once | INTRAMUSCULAR | Status: AC
  Administered 2019-01-09: 125 mg via INTRAMUSCULAR

## 2019-01-09 MED ORDER — CETIRIZINE HCL 10 MG PO CAPS: 10.0000 mg | ORAL_CAPSULE | Freq: Every day | ORAL | 0 refills | Status: DC

## 2019-01-09 NOTE — Discharge Instructions (Addendum)
We gave you a shot of Solu-Medrol Please begin with course of prednisone later today or tomorrow morning.  Try to take with food and in the morning if you are able.  Begin with 6 tablets, decrease by 1 tablet each day until you take 1 tablet on day 6 Daily cetirizine in the morning May supplement with Benadryl at nighttime May apply Kenalog/triamcinolone cream on areas of significant itching, do not apply diffusely  Please follow-up if rash worsening, not resolving, affecting eye

## 2019-01-09 NOTE — ED Provider Notes (Signed)
[facility]    CSN: [id number] Arrival date & time: 01/09/19  1031      History   Chief Complaint Chief Complaint  Patient presents with  . Poison Ivy    HPI Connor Cervantes is a 23 y.o. male no significant past medical history presenting today for evaluation of poison ivy.  Patient states that approximately 1 week ago he was working in the yard and believes he gotten poison ivy.  Since he has developed a rash that has continually spread over the past week.  Has extended to bilateral upper and lower extremities, trunk as well as now face.  He has been using over-the-counter cortisone and Benadryl without relief.  Concerned rashes continue to spread.  Denies vision changes.  Denies fevers.  Denies associated URI symptoms.  Similar breakout previously.  HPI  History reviewed. No pertinent past medical history.  Patient Active Problem List   Diagnosis Date Noted  . ADD (attention deficit disorder) 06/18/2013    History reviewed. No pertinent surgical history.     Home Medications    Prior to Admission medications   Medication Sig Start Date End Date Taking? Authorizing Provider  amphetamine-dextroamphetamine (ADDERALL XR) 15 MG 24 hr capsule Take 1 capsule by mouth daily. 06/06/18  Yes Jeannine BogaWeekley, Lindsay, NP  Cetirizine HCl 10 MG CAPS Take 1 capsule (10 mg total) by mouth daily for 10 days. 01/09/19 01/19/19  ,  C, PA-C  predniSONE (DELTASONE) 10 MG tablet Begin with 6 tablets on day 1, 5 tabs on day 2, 4 tabs on day 3, 3 tab on day 4, 2 tabs on day 5, 1 tab on day 6 01/09/19   ,  C, PA-C  triamcinolone cream (KENALOG) 0.1 % Apply 1 application topically 2 (two) times daily. 01/09/19   , Junius Creamer C, PA-C    Family History Family History  Problem Relation Age of Onset  . Healthy Mother   . Hypertension Father     Social History Social History   Tobacco Use  . Smoking status: Never Smoker  . Smokeless tobacco: Never Used  Substance  Use Topics  . Alcohol use: Yes    Comment: occasionally  . Drug use: No     Allergies   Patient has no known allergies.   Review of Systems Review of Systems  Constitutional: Negative for fatigue and fever.  Eyes: Negative for redness, itching and visual disturbance.  Respiratory: Negative for shortness of breath.   Cardiovascular: Negative for chest pain and leg swelling.  Gastrointestinal: Negative for nausea and vomiting.  Musculoskeletal: Negative for arthralgias and myalgias.  Skin: Positive for color change and rash. Negative for wound.  Neurological: Negative for dizziness, syncope, weakness, light-headedness and headaches.     Physical Exam Triage Vital Signs ED Triage Vitals  Enc Vitals Group     BP 01/09/19 1059 123/65     Pulse Rate 01/09/19 1059 62     Resp 01/09/19 1059 16     Temp 01/09/19 1059 98.4 F (36.9 C)     Temp Source 01/09/19 1059 Oral     SpO2 01/09/19 1059 100 %     Weight --      Height --      Head Circumference --      Peak Flow --      Pain Score 01/09/19 1056 0     Pain Loc --      Pain Edu? --      Excl. in GC? --  No data found.  Updated Vital Signs BP 123/65 (BP Location: Right Arm)   Pulse 62   Temp 98.4 F (36.9 C) (Oral)   Resp 16   SpO2 100%   Visual Acuity Right Eye Distance:   Left Eye Distance:   Bilateral Distance:    Right Eye Near:   Left Eye Near:    Bilateral Near:     Physical Exam Vitals signs and nursing note reviewed.  Constitutional:      Appearance: He is well-developed.     Comments: No acute distress  HENT:     Head: Normocephalic and atraumatic.     Nose: Nose normal.  Eyes:     Conjunctiva/sclera: Conjunctivae normal.  Neck:     Musculoskeletal: Neck supple.  Cardiovascular:     Rate and Rhythm: Normal rate.  Pulmonary:     Effort: Pulmonary effort is normal. No respiratory distress.  Abdominal:     General: There is no distension.  Musculoskeletal: Normal range of motion.   Skin:    General: Skin is warm and dry.     Comments: Diffusely erythematous rash to upper and lower extremities, trunk as well as face.  Has mild erythema noted near right eye.  Various areas where the rash extends linearly, occasional vesicular lesions.  Neurological:     Mental Status: He is alert and oriented to person, place, and time.      UC Treatments / Results  Labs (all labs ordered are listed, but only abnormal results are displayed) Labs Reviewed - No data to display  EKG   Radiology No results found.  Procedures Procedures (including critical care time)  Medications Ordered in UC Medications  methylPREDNISolone sodium succinate (SOLU-MEDROL) 125 mg/2 mL injection 125 mg (has no administration in time range)    Initial Impression / Assessment and Plan / UC Course  I have reviewed the triage vital signs and the nursing notes.  Pertinent labs & imaging results that were available during my care of the patient were reviewed by me and considered in my medical decision making (see chart for details).    Rash suggestive of contact dermatitis.  Given involvement of face near I will initiate on Solu-Medrol IM 125 today.  Will continue on prednisone taper x6 days.  Continue antihistamines, daily cetirizine, Benadryl at nighttime.  Provided topical Kenalog to apply to areas still causing significant itching.  No systemic symptoms, patient stable.Discussed strict return precautions. Patient verbalized understanding and is agreeable with plan.  Final Clinical Impressions(s) / UC Diagnoses   Final diagnoses:  Allergic contact dermatitis, unspecified trigger     Discharge Instructions     We gave you a shot of Solu-Medrol Please begin with course of prednisone later today or tomorrow morning.  Try to take with food and in the morning if you are able.  Begin with 6 tablets, decrease by 1 tablet each day until you take 1 tablet on day 6 Daily cetirizine in the morning May  supplement with Benadryl at nighttime May apply Kenalog/triamcinolone cream on areas of significant itching, do not apply diffusely  Please follow-up if rash worsening, not resolving, affecting eye   ED Prescriptions    Medication Sig Dispense Auth. Provider   predniSONE (DELTASONE) 10 MG tablet Begin with 6 tablets on day 1, 5 tabs on day 2, 4 tabs on day 3, 3 tab on day 4, 2 tabs on day 5, 1 tab on day 6 21 tablet , [place]ie C, PA-C  triamcinolone cream (KENALOG) 0.1 %  (Status: Discontinued) Apply 1 application topically 2 (two) times daily. 30 g ,  C, PA-C   Cetirizine HCl 10 MG CAPS Take 1 capsule (10 mg total) by mouth daily for 10 days. 10 capsule ,  C, PA-C   triamcinolone cream (KENALOG) 0.1 % Apply 1 application topically 2 (two) times daily. 45 g , [place] C, PA-C     Controlled Substance Prescriptions [place] Controlled Substance Registry consulted? Not Applicable   Janith Lima, [place] 01/09/19 1135

## 2019-01-09 NOTE — ED Triage Notes (Signed)
Patient presents to Urgent Care with complaints of poison ivy outbreak since last week. Patient reports he had a bad outbreak when he was a kid, has not had one in over 10 years. Rash has spread to his face, legs, arms, and abdomen. Pt reports using cortisone cream and benadryl but the rash is still spreading.

## 2019-01-09 NOTE — ED Notes (Signed)
Pt experiencing light-headedness and pallor post-IM injection. Pt provided with ice water and reclined on exam table. Door left open, will continue to monitor and discharge when it is safe for patient to safely drive himself home.

## 2019-05-02 ENCOUNTER — Other Ambulatory Visit: Payer: Self-pay

## 2019-05-02 DIAGNOSIS — Z20822 Contact with and (suspected) exposure to covid-19: Secondary | ICD-10-CM

## 2019-05-04 LAB — NOVEL CORONAVIRUS, NAA: SARS-CoV-2, NAA: NOT DETECTED

## 2019-05-09 ENCOUNTER — Other Ambulatory Visit: Payer: 59

## 2019-05-15 ENCOUNTER — Other Ambulatory Visit: Payer: Self-pay

## 2019-05-15 ENCOUNTER — Ambulatory Visit: Payer: 59 | Attending: Family Medicine

## 2019-05-15 DIAGNOSIS — Z20822 Contact with and (suspected) exposure to covid-19: Secondary | ICD-10-CM

## 2019-05-17 LAB — NOVEL CORONAVIRUS, NAA: SARS-CoV-2, NAA: NOT DETECTED

## 2019-09-21 ENCOUNTER — Telehealth: Payer: Self-pay | Admitting: Family Medicine

## 2019-09-21 NOTE — Telephone Encounter (Signed)
Pt needs copies of his medical records  He would like to get them digitally  He needs them ASAP to turn in with his [employer] contract   Please call pt if ?'s & when done

## 2019-09-26 NOTE — Telephone Encounter (Signed)
Records are upfront for pick must sign release

## 2019-10-05 ENCOUNTER — Ambulatory Visit (INDEPENDENT_AMBULATORY_CARE_PROVIDER_SITE_OTHER): Payer: 59 | Admitting: Family Medicine

## 2019-10-05 ENCOUNTER — Other Ambulatory Visit: Payer: Self-pay

## 2019-10-05 ENCOUNTER — Ambulatory Visit: Payer: 59 | Admitting: Family Medicine

## 2019-10-05 ENCOUNTER — Encounter: Payer: Self-pay | Admitting: Family Medicine

## 2019-10-05 VITALS — BP 128/74 | Temp 97.5°F | Ht 75.0 in | Wt 186.0 lb

## 2019-10-05 DIAGNOSIS — R21 Rash and other nonspecific skin eruption: Secondary | ICD-10-CM | POA: Diagnosis not present

## 2019-10-05 MED ORDER — SULFAMETHOXAZOLE-TRIMETHOPRIM 800-160 MG PO TABS
1.0000 | ORAL_TABLET | Freq: Two times a day (BID) | ORAL | 0 refills | Status: AC
Start: 2019-10-05 — End: ?

## 2019-10-05 NOTE — Progress Notes (Signed)
   Subjective:    Patient ID: Connor Cervantes, male    DOB: 1995/10/05, 24 y.o.   MRN: [id number]  HPIpt thinks a spider bite him on left lower leg. Some pain when touching area. Has not tried any treatments.   Patient recalls no specific bite but he was outdoors quite a bit.  Several days ago  Started as a small bump tender painful  Now larger more so  No fever no chills no rash elsewhere  Review of Systems     Objective:   Physical Exam  Alert vitals stable, NAD. Blood pressure good on repeat. HEENT normal. Lungs clear. Heart regular rate and rhythm. Lateral left leg erythematous papule tender central break in the skin      Assessment & Plan:  Impression potential secondarily infected nonvenomous bite.  Discussed.  Management discussed.  Antibiotics prescribed symptom care discussed Bactrim DS.  Cannot take doxycycline due to Accutane prescription

## 2019-12-23 DEATH — deceased

## 2020-01-23 DEATH — deceased
